# Patient Record
Sex: Male | Born: 1995 | Race: White | Hispanic: No | Marital: Single | Smoking: Current every day smoker
Health system: Southern US, Community
[De-identification: ages and names within clinical notes are randomized; demographics above are authoritative.]

## PROBLEM LIST (undated history)

## (undated) DIAGNOSIS — R569 Unspecified convulsions: Principal | ICD-10-CM

## (undated) HISTORY — DX: Unspecified convulsions: R56.9

---
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
## 2015-11-06 ENCOUNTER — Emergency Department (HOSPITAL_COMMUNITY): Payer: 59

## 2015-11-06 ENCOUNTER — Emergency Department (HOSPITAL_COMMUNITY)
Admission: EM | Admit: 2015-11-06 | Discharge: 2015-11-07 | Disposition: A | Discharge status: Home / Self Care | Payer: 59 | Attending: Emergency Medicine | Admitting: Emergency Medicine

## 2015-11-06 ENCOUNTER — Encounter (HOSPITAL_COMMUNITY): Payer: Self-pay | Admitting: Emergency Medicine

## 2015-11-06 DIAGNOSIS — R569 Unspecified convulsions: Secondary | ICD-10-CM | POA: Diagnosis not present

## 2015-11-06 LAB — BASIC METABOLIC PANEL
Anion gap: 12 (ref 5–15)
BUN: 13 mg/dL (ref 6–20)
CALCIUM: 9.5 mg/dL (ref 8.9–10.3)
CHLORIDE: 105 mmol/L (ref 101–111)
CO2: 23 mmol/L (ref 22–32)
CREATININE: 1.06 mg/dL (ref 0.61–1.24)
GFR calc Af Amer: >60 mL/min (ref >60)
GFR calc non Af Amer: >60 mL/min (ref >60)
GLUCOSE: 110 mg/dL — AB (ref 65–99)
Potassium: 3.9 mmol/L (ref 3.5–5.1)
Sodium: 140 mmol/L (ref 135–145)

## 2015-11-06 LAB — CBC
HCT: 45.2 % (ref 39.0–52.0)
Hemoglobin: 15.6 g/dL (ref 13.0–17.0)
MCH: 31.6 pg (ref 26.0–34.0)
MCHC: 34.5 g/dL (ref 30.0–36.0)
MCV: 91.5 fL (ref 78.0–100.0)
PLATELETS: 224 10*3/uL (ref 150–400)
RBC: 4.94 MIL/uL (ref 4.22–5.81)
RDW: 13.1 % (ref 11.5–15.5)
WBC: 15.2 10*3/uL — ABNORMAL HIGH (ref 4.0–10.5)

## 2015-11-06 NOTE — ED Notes (Signed)
Patient transported to CT 

## 2015-11-06 NOTE — ED Notes (Signed)
Patient presents with family for seizure like activity x2-3 minutes. Mother reports fists clinched, body tensed, denies urinary incontinence or oral trauma. Denies c/c.

## 2015-11-06 NOTE — ED Provider Notes (Signed)
CSN: [id number]     Arrival date & time 11/06/15  2201 History   First MD Initiated Contact with Patient 11/06/15 2259     Chief Complaint  Patient presents with  . Seizures     The history is provided by the patient. No language interpreter was used.   Joel Hicks is a 20 y.o. male who presents to the Emergency Department complaining of seizure. History is provided by the patient and his father. The patient was getting his car to get his girlfriend's house this afternoon. His mother heard a noise coming from the driveway and went to investigate. It appeared that he tried to cart started the car repeatedly and she found him having generalized seizure-like activity in the car. His father had to frequently due to the vehicle to get them out.  He was removed from the car he was stiffened up and breathing spontaneously. He has no other having the generalized shaking. He did have a postictal period when he was confused and had slurred speech. Currently the patient has no complaints. He does not recall the event. There was no urinary incontinence or tongue biting. His father has a history of AVM with one time a grand mal seizure. There is no other significant family history. The patient has occasional alcohol use, no drug use. 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  History reviewed. No pertinent past medical history. History reviewed. No pertinent past surgical history. No family history on file. Social History  Substance Use Topics  . Smoking status: Never Smoker   . Smokeless tobacco: None  . Alcohol Use: Yes    Review of Systems  All other systems reviewed and are negative.     Allergies  Review of patient's allergies indicates no known allergies.  Home Medications   Prior to Admission medications   Not on File   BP 112/53 mmHg  Pulse 68  Temp(Src) 97.8 F (36.6 C) (Oral)  Resp 14  Wt 145 lb (65.772 kg)  SpO2 98% Physical Exam  Constitutional: He is oriented to  person, place, and time. He appears well-developed and well-nourished.  HENT:  Head: Normocephalic and atraumatic.  No intra oral injury.  Eyes: EOM are normal. Pupils are equal, round, and reactive to light.  Cardiovascular: Normal rate and regular rhythm.   No murmur heard. Pulmonary/Chest: Effort normal and breath sounds normal. No respiratory distress.  Abdominal: Soft. There is no tenderness. There is no rebound and no guarding.  Musculoskeletal: He exhibits no edema or tenderness.  Neurological: He is alert and oriented to person, place, and time. No cranial nerve deficit.  Skin: Skin is warm and dry.  Psychiatric: He has a normal mood and affect. His behavior is normal.  Nursing note and vitals reviewed.   ED Course  Procedures (including critical care time) Labs Review Labs Reviewed  BASIC METABOLIC PANEL - Abnormal; Notable for the following:    Glucose, Bld 110 (*)    All other components within normal limits  CBC - Abnormal; Notable for the following:    WBC 15.2 (*)    All other components within normal limits  URINALYSIS, ROUTINE W REFLEX MICROSCOPIC (NOT AT [facility]) - Abnormal; Notable for the following:    Color, Urine AMBER (*)    APPearance CLOUDY (*)    Protein, ur 100 (*)    All other components within normal limits  URINE MICROSCOPIC-ADD ON - Abnormal; Notable for the following:    Squamous Epithelial /  LPF 0-5 (*)    Bacteria, UA RARE (*)    Casts GRANULAR CAST (*)    All other components within normal limits  URINE RAPID DRUG SCREEN, HOSP PERFORMED  CBG MONITORING, ED    Imaging Review Ct Head Wo Contrast  11/06/2015  CLINICAL DATA:  2-3 minutes episode of seizure like activity, clenched fist, no incontinence. Family history of AVM. EXAM: CT HEAD WITHOUT CONTRAST TECHNIQUE: Contiguous axial images were obtained from the base of the skull through the vertex without intravenous contrast. COMPARISON:  None. FINDINGS: The ventricles and sulci are normal. No  intraparenchymal hemorrhage, mass effect nor midline shift. No acute large vascular territory infarcts. No abnormal extra-axial fluid collections. Basal cisterns are patent. No skull fracture. The included ocular globes and orbital contents are non-suspicious. Imaged paranasal sinuses are well-aerated. Tiny RIGHT ethmoid osteoma. Small LEFT mastoid effusion. Fullness of the nasopharyngeal soft tissues compatible with patient's young age. IMPRESSION: Negative noncontrast CT head. Small LEFT mastoid effusion. Electronically Signed   By: Awilda Metro M.D.   On: 11/06/2015 23:58   I have personally reviewed and evaluated these images and lab results as part of my medical decision-making.   EKG Interpretation   Date/Time:  Tuesday November 06 2015 23:33:01 EST Ventricular Rate:  73 PR Interval:  150 QRS Duration: 81 QT Interval:  384 QTC Calculation: 423 R Axis:   86 Text Interpretation:  Sinus rhythm LVH by voltage ST elev, probable normal  early repol pattern Confirmed by Lincoln Brigham [phone]) on 11/06/2015 11:47:41  PM      MDM   Final diagnoses:  New onset seizure [facility])    Patient here for evaluation of new onset seizure-like activity. He is asymptomatic in the emergency department and amnestic to events. He has no focal neurologic deficits. No evidence of acute intracranial abnormality. Exam is not consistent with mastoiditis. Discussed with patient and parents new-onset seizure and seizure precautions. Discussed the importance of avoiding driving or any activities that would put him at risk if he were to have a recurrent seizure. Discussed the importance of neurology follow-up as well as ER return precautions.    Tilden Fossa, MD 11/07/15 [phone]

## 2015-11-07 ENCOUNTER — Ambulatory Visit (INDEPENDENT_AMBULATORY_CARE_PROVIDER_SITE_OTHER): Payer: 59 | Admitting: Neurology

## 2015-11-07 ENCOUNTER — Encounter: Payer: Self-pay | Admitting: Neurology

## 2015-11-07 VITALS — BP 96/59 | HR 63 | Ht 72.0 in | Wt 153.5 lb

## 2015-11-07 DIAGNOSIS — R569 Unspecified convulsions: Secondary | ICD-10-CM | POA: Insufficient documentation

## 2015-11-07 HISTORY — DX: Unspecified convulsions: R56.9

## 2015-11-07 LAB — URINE MICROSCOPIC-ADD ON

## 2015-11-07 LAB — URINALYSIS, ROUTINE W REFLEX MICROSCOPIC
BILIRUBIN URINE: NEGATIVE
Glucose, UA: NEGATIVE mg/dL
Hgb urine dipstick: NEGATIVE
Ketones, ur: NEGATIVE mg/dL
Leukocytes, UA: NEGATIVE
NITRITE: NEGATIVE
Protein, ur: 100 mg/dL — AB
SPECIFIC GRAVITY, URINE: 1.029 (ref 1.005–1.030)
pH: 5 (ref 5.0–8.0)

## 2015-11-07 LAB — RAPID URINE DRUG SCREEN, HOSP PERFORMED
Amphetamines: NOT DETECTED
BARBITURATES: NOT DETECTED
Benzodiazepines: NOT DETECTED
Cocaine: NOT DETECTED
Opiates: NOT DETECTED
Tetrahydrocannabinol: NOT DETECTED

## 2015-11-07 MED ORDER — LEVETIRACETAM 500 MG PO TABS: 500.0000 mg | ORAL_TABLET | Freq: Two times a day (BID) | ORAL | Status: DC

## 2015-11-07 NOTE — ED Notes (Signed)
Discharge instructions and follow up care reviewed with patient and family. Patient and family verbalized understanding. 

## 2015-11-07 NOTE — Patient Instructions (Signed)
We will check MRI of the brain and get EEG evaluation. We will start Keppra for the seizures.   Epilepsy Epilepsy is a disorder in which a person has repeated seizures over time. A seizure is a release of abnormal electrical activity in the brain. Seizures can cause a change in attention, behavior, or the ability to remain awake and alert (altered mental status). Seizures often involve uncontrollable shaking (convulsions).  Most people with epilepsy lead normal lives. However, people with epilepsy are at an increased risk of falls, accidents, and injuries. Therefore, it is important to begin treatment right away. CAUSES  Epilepsy has many possible causes. Anything that disturbs the normal pattern of brain cell activity can lead to seizures. This may include:   Head injury.  Birth trauma.  High fever as a child.  Stroke.  Bleeding into or around the brain.  Certain drugs.  Prolonged low oxygen, such as what occurs after CPR efforts.  Abnormal brain development.  Certain illnesses, such as meningitis, encephalitis (brain infection), malaria, and other infections.  An imbalance of nerve signaling chemicals (neurotransmitters).  SIGNS AND SYMPTOMS  The symptoms of a seizure can vary greatly from one person to another. Right before a seizure, you may have a warning (aura) that a seizure is about to occur. An aura may include the following symptoms:  Fear or anxiety.  Nausea.  Feeling like the room is spinning (vertigo).  Vision changes, such as seeing flashing lights or spots. Common symptoms during a seizure include:  Abnormal sensations, such as an abnormal smell or a bitter taste in the mouth.   Sudden, general body stiffness.   Convulsions that involve rhythmic jerking of the face, arm, or leg on one or both sides.   Sudden change in consciousness.   Appearing to be awake but not responding.   Appearing to be asleep but cannot be awakened.   Grimacing,  chewing, lip smacking, drooling, tongue biting, or loss of bowel or bladder control. After a seizure, you may feel sleepy for a while. DIAGNOSIS  Your health care provider will ask about your symptoms and take a medical history. Descriptions from any witnesses to your seizures will be very helpful in the diagnosis. A physical exam, including a detailed neurological exam, is necessary. Various tests may be done, such as:   An electroencephalogram (EEG). This is a painless test of your brain waves. In this test, a diagram is created of your brain waves. These diagrams can be interpreted by a specialist.  An MRI of the brain.   A CT scan of the brain.   A spinal tap (lumbar puncture, LP).  Blood tests to check for signs of infection or abnormal blood chemistry. TREATMENT  There is no cure for epilepsy, but it is generally treatable. Once epilepsy is diagnosed, it is important to begin treatment as soon as possible. For most people with epilepsy, seizures can be controlled with medicines. The following may also be used:  A pacemaker for the brain (vagus nerve stimulator) can be used for people with seizures that are not well controlled by medicine.  Surgery on the brain. For some people, epilepsy eventually goes away. HOME CARE INSTRUCTIONS   Follow your health care provider's recommendations on driving and safety in normal activities.  Get enough rest. Lack of sleep can cause seizures.  Only take over-the-counter or prescription medicines as directed by your health care provider. Take any prescribed medicine exactly as directed.  Avoid any known triggers of  your seizures.  Keep a seizure diary. Record what you recall about any seizure, especially any possible trigger.   Make sure the people you live and work with know that you are prone to seizures. They should receive instructions on how to help you. In general, a witness to a seizure should:   Cushion your head and body.    Turn you on your side.   Avoid unnecessarily restraining you.   Not place anything inside your mouth.   Call for emergency medical help if there is any question about what has occurred.   Follow up with your health care provider as directed. You may need regular blood tests to monitor the levels of your medicine.  SEEK MEDICAL CARE IF:   You develop signs of infection or other illness. This might increase the risk of a seizure.   You seem to be having more frequent seizures.   Your seizure pattern is changing.  SEEK IMMEDIATE MEDICAL CARE IF:   You have a seizure that does not stop after a few moments.   You have a seizure that causes any difficulty in breathing.   You have a seizure that results in a very severe headache.   You have a seizure that leaves you with the inability to speak or use a part of your body.    This information is not intended to replace advice given to you by your health care provider. Make sure you discuss any questions you have with your health care provider.   Document Released: 10/06/2005 Document Revised: 07/27/2013 Document Reviewed: 05/18/2013 Elsevier Interactive Patient Education [employer]

## 2015-11-07 NOTE — Progress Notes (Signed)
Reason for visit: Seizures  Referring physician: [place]  Joel Hicks is a 20 y.o. male  History of present illness:  Joel Hicks is a 20 year old right-handed white male without significant past medical history. The patient had a witnessed generalized seizure event on the day prior to this evaluation, he went to the emergency room. The patient was getting in the car to drive, he had onset of the seizure at that time. The patient did not bite his tongue or lose control of the bowels or the bladder. The patient had no warning with the seizure event. He has no prior history of head trauma, no history of febrile seizures. The father has a history of an AVM, he has had one seizure previously. The patient underwent a CT scan of brain that was unremarkable, blood work showed elevation in the white blood count consistent with a prior seizure. Urine drug screen was negative. The patient had a second seizure today around 12 noon, again without warning. The patient did not bite his tongue or lose control of the bowels or the bladder. He comes into the office today for an evaluation.  Past Medical History  Diagnosis Date  . Seizures (HCC) 11/07/2015    History reviewed. No pertinent past surgical history.  Family History  Problem Relation Age of Onset  . AVM Father   . Seizures Father     Social history:  reports that he has never smoked. He has never used smokeless tobacco. He reports that he drinks alcohol. He reports that he does not use illicit drugs.  Medications:  Prior to Admission medications   Not on File     No Known Allergies  ROS:  Out of a complete 14 system review of symptoms, the patient complains only of the following symptoms, and all other reviewed systems are negative.  Seizures  Blood pressure 96/59, pulse 63, height 6' (1.829 m), weight 153 lb 8 oz (69.627 kg).  Physical Exam  General: The patient is alert and cooperative at the time of the  examination.  Eyes: Pupils are equal, round, and reactive to light. Discs are flat bilaterally.  Neck: The neck is supple, no carotid bruits are noted.  Respiratory: The respiratory examination is clear.  Cardiovascular: The cardiovascular examination reveals a regular rate and rhythm, no obvious murmurs or rubs are noted.  Skin: Extremities are without significant edema.  Neurologic Exam  Mental status: The patient is alert and oriented x 3 at the time of the examination. The patient has apparent normal recent and remote memory, with an apparently normal attention span and concentration ability.  Cranial nerves: Facial symmetry is present. There is good sensation of the face to pinprick and soft touch bilaterally. The strength of the facial muscles and the muscles to head turning and shoulder shrug are normal bilaterally. Speech is well enunciated, no aphasia or dysarthria is noted. Extraocular movements are full. Visual fields are full. The tongue is midline, and the patient has symmetric elevation of the soft palate. No obvious hearing deficits are noted.  Motor: The motor testing reveals 5 over 5 strength of all 4 extremities. Good symmetric motor tone is noted throughout.  Sensory: Sensory testing is intact to pinprick, soft touch, vibration sensation, and position sense on all 4 extremities. No evidence of extinction is noted.  Coordination: Cerebellar testing reveals good finger-nose-finger and heel-to-shin bilaterally.  Gait and station: Gait is normal. Tandem gait is normal. Romberg is negative. No drift is  seen.  Reflexes: Deep tendon reflexes are symmetric and normal bilaterally. Toes are downgoing bilaterally.   CT head 11/06/15:  IMPRESSION: Negative noncontrast CT head.  Small LEFT mastoid effusion.  * CT scan images were reviewed online. I agree with the written report.    Assessment/Plan:  1. Generalized seizure events  The patient has had 2 seizures within  several hours of each other. He will be placed on Keppra taking 500 mg twice daily. He will be set up for EEG evaluation, and MRI of the brain with and without gadolinium enhancement. He will receive a 1 g Depacon load IV before he leaves the office to give him seizure coverage while he gets on the Keppra dosing. He is not operating motor vehicle. The patient may return to work as long as he does not climb to heights or operate a car or heavy equipment. He will follow-up in 3 months. No driving for 6 months following the last seizure.  Marlan Palau MD 11/07/2015 7:34 PM  [facility] [address] [place], [place] [address]  Phone [phone] Fax [phone]

## 2015-11-07 NOTE — Discharge Instructions (Signed)

## 2015-11-07 NOTE — ED Notes (Signed)
MD at bedside. 

## 2015-11-08 ENCOUNTER — Ambulatory Visit: Payer: Self-pay | Admitting: Neurology

## 2015-11-14 ENCOUNTER — Ambulatory Visit (INDEPENDENT_AMBULATORY_CARE_PROVIDER_SITE_OTHER): Payer: 59

## 2015-11-14 DIAGNOSIS — R569 Unspecified convulsions: Secondary | ICD-10-CM | POA: Diagnosis not present

## 2015-11-14 MED ORDER — GADOPENTETATE DIMEGLUMINE 469.01 MG/ML IV SOLN: 15.0000 mL | Freq: Once | INTRAVENOUS | Status: AC | PRN

## 2015-11-15 ENCOUNTER — Telehealth: Payer: Self-pay | Admitting: Neurology

## 2015-11-15 NOTE — Telephone Encounter (Signed)
  I called the patient, talked with the mother. The MRI of the brain was normal. She indicates that the maternal uncle had seizures beginning at age 22, and that narcolepsy is seen in 3 or 4 family members on the mother's side.  MRI brain 11/15/15:  IMPRESSION:  Unremarkable MRI brain (with and without). Mild non-specific fluid/inflammation within the left mastoid air cells. No acute findings.

## 2015-12-05 ENCOUNTER — Ambulatory Visit (INDEPENDENT_AMBULATORY_CARE_PROVIDER_SITE_OTHER): Payer: BLUE CROSS/BLUE SHIELD | Admitting: Neurology

## 2015-12-05 ENCOUNTER — Telehealth: Payer: Self-pay | Admitting: Neurology

## 2015-12-05 DIAGNOSIS — R569 Unspecified convulsions: Secondary | ICD-10-CM

## 2015-12-05 NOTE — Telephone Encounter (Signed)
I called the patient. The EEG study was OK. He is to stay on Keppra.

## 2015-12-05 NOTE — Procedures (Signed)
    History:  Joel Hicks is a 20 year old patient with a history of 2 last seizures around 11/06/2015. The episodes were generalized in nature, without warning. The patient is being evaluated for these events.  This is a routine EEG. No skull defects are noted. Medications include Keppra.   EEG classification: Normal awake and drowsy  Description of the recording: The background rhythms of this recording consists of a fairly well modulated medium amplitude alpha rhythm of 10 Hz that is reactive to eye opening and closure. As the record progresses, the patient appears to remain in the waking state throughout the recording. Photic stimulation was performed, resulting in a bilateral and symmetric photic driving response. Hyperventilation was also performed, resulting in a minimal buildup of the background rhythm activities without significant slowing seen. Toward the end of the recording, the patient enters the drowsy state with slight symmetric slowing seen. The patient never enters stage II sleep. At no time during the recording does there appear to be evidence of spike or spike wave discharges or evidence of focal slowing. EKG monitor shows no evidence of cardiac rhythm abnormalities with a heart rate of 60.  Impression: This is a normal EEG recording in the waking and drowsy state. No evidence of ictal or interictal discharges are seen.

## 2016-01-07 ENCOUNTER — Telehealth: Payer: Self-pay | Admitting: Neurology

## 2016-01-07 MED ORDER — LEVETIRACETAM 500 MG PO TABS: 500.0000 mg | ORAL_TABLET | Freq: Two times a day (BID) | ORAL | Status: DC

## 2016-01-07 NOTE — Telephone Encounter (Signed)
Done.  Spoke to mother.  Appt 02-19-16.

## 2016-01-07 NOTE — Telephone Encounter (Signed)
Mother called to advise Rx for levETIRAcetam (KEPPRA) 500 MG tablet will expire just before next appointment 02/19/16 with Dr. Anne HahnWillis.

## 2016-01-07 NOTE — Telephone Encounter (Signed)
May reach Mother at work# [phone] or cell# [phone]

## 2016-01-21 ENCOUNTER — Emergency Department (HOSPITAL_COMMUNITY)
Admission: EM | Admit: 2016-01-21 | Discharge: 2016-01-21 | Disposition: A | Discharge status: Home / Self Care | Payer: BLUE CROSS/BLUE SHIELD | Attending: Emergency Medicine | Admitting: Emergency Medicine

## 2016-01-21 ENCOUNTER — Emergency Department (HOSPITAL_COMMUNITY): Payer: BLUE CROSS/BLUE SHIELD

## 2016-01-21 ENCOUNTER — Encounter (HOSPITAL_COMMUNITY): Payer: Self-pay | Admitting: *Deleted

## 2016-01-21 DIAGNOSIS — R569 Unspecified convulsions: Secondary | ICD-10-CM | POA: Insufficient documentation

## 2016-01-21 DIAGNOSIS — Y9389 Activity, other specified: Secondary | ICD-10-CM | POA: Insufficient documentation

## 2016-01-21 DIAGNOSIS — W01198A Fall on same level from slipping, tripping and stumbling with subsequent striking against other object, initial encounter: Secondary | ICD-10-CM | POA: Insufficient documentation

## 2016-01-21 DIAGNOSIS — Z23 Encounter for immunization: Secondary | ICD-10-CM | POA: Insufficient documentation

## 2016-01-21 DIAGNOSIS — S0990XA Unspecified injury of head, initial encounter: Secondary | ICD-10-CM | POA: Diagnosis present

## 2016-01-21 DIAGNOSIS — Y990 Civilian activity done for income or pay: Secondary | ICD-10-CM | POA: Diagnosis not present

## 2016-01-21 DIAGNOSIS — Y9289 Other specified places as the place of occurrence of the external cause: Secondary | ICD-10-CM | POA: Insufficient documentation

## 2016-01-21 DIAGNOSIS — Z79899 Other long term (current) drug therapy: Secondary | ICD-10-CM | POA: Insufficient documentation

## 2016-01-21 DIAGNOSIS — S0101XA Laceration without foreign body of scalp, initial encounter: Secondary | ICD-10-CM | POA: Insufficient documentation

## 2016-01-21 DIAGNOSIS — S0181XA Laceration without foreign body of other part of head, initial encounter: Secondary | ICD-10-CM

## 2016-01-21 DIAGNOSIS — F17200 Nicotine dependence, unspecified, uncomplicated: Secondary | ICD-10-CM | POA: Insufficient documentation

## 2016-01-21 LAB — BASIC METABOLIC PANEL
Anion gap: 12 (ref 5–15)
BUN: 17 mg/dL (ref 6–20)
CHLORIDE: 103 mmol/L (ref 101–111)
CO2: 25 mmol/L (ref 22–32)
CREATININE: 1.08 mg/dL (ref 0.61–1.24)
Calcium: 9.6 mg/dL (ref 8.9–10.3)
GFR calc Af Amer: >60 mL/min (ref >60)
GFR calc non Af Amer: >60 mL/min (ref >60)
GLUCOSE: 117 mg/dL — AB (ref 65–99)
Potassium: 3.2 mmol/L — ABNORMAL LOW (ref 3.5–5.1)
Sodium: 140 mmol/L (ref 135–145)

## 2016-01-21 LAB — CBC
HCT: 41.4 % (ref 39.0–52.0)
Hemoglobin: 14.3 g/dL (ref 13.0–17.0)
MCH: 30.6 pg (ref 26.0–34.0)
MCHC: 34.5 g/dL (ref 30.0–36.0)
MCV: 88.5 fL (ref 78.0–100.0)
PLATELETS: 188 10*3/uL (ref 150–400)
RBC: 4.68 MIL/uL (ref 4.22–5.81)
RDW: 12.8 % (ref 11.5–15.5)
WBC: 8.5 10*3/uL (ref 4.0–10.5)

## 2016-01-21 MED ORDER — SODIUM CHLORIDE 0.9 % IV SOLN
500.0000 mg | Freq: Once | INTRAVENOUS | Status: AC
  Administered 2016-01-21: 500 mg via INTRAVENOUS
  Filled 2016-01-21: qty 5

## 2016-01-21 MED ORDER — LIDOCAINE-EPINEPHRINE (PF) 2 %-1:200000 IJ SOLN
20.0000 mL | Freq: Once | INTRAMUSCULAR | Status: AC
  Administered 2016-01-21: 20 mL via INTRADERMAL
  Filled 2016-01-21: qty 20

## 2016-01-21 MED ORDER — TETANUS-DIPHTH-ACELL PERTUSSIS 5-2.5-18.5 LF-MCG/0.5 IM SUSP
0.5000 mL | Freq: Once | INTRAMUSCULAR | Status: AC
  Administered 2016-01-21: 0.5 mL via INTRAMUSCULAR
  Filled 2016-01-21: qty 0.5

## 2016-01-21 MED ORDER — BACITRACIN ZINC 500 UNIT/GM EX OINT
1.0000 "application " | TOPICAL_OINTMENT | Freq: Two times a day (BID) | CUTANEOUS | Status: DC
  Administered 2016-01-21: 1 via TOPICAL
  Filled 2016-01-21: qty 0.9

## 2016-01-21 NOTE — ED Notes (Signed)
Tech removed IV from pt's left AC. IV was clean, dry and intact.

## 2016-01-21 NOTE — ED Notes (Signed)
Pt was working at a [employer] where he was working and lifting a large pole (200-300lbs)  Pt fell back and struck his head, pole rolled on him.  30sec seizure activity with this.  Bystander was unsure if the seizure or the fall occurred first.  Pt does not recall this event and denies any seizure activity or heavy drinking or drugs.  Pt is alert and oriented now and has abrasion to right cheek (from pole) and laceration to left side of the head.  Pt has HA, no neuro deficit.  [facility]

## 2016-01-21 NOTE — ED Notes (Signed)
Keppra shows up on his home meds, pt denies that he is taking this

## 2016-01-21 NOTE — ED Provider Notes (Signed)
CSN: [id number]     Arrival date & time 01/21/16  1625 History   First MD Initiated Contact with Patient 01/21/16 1625     Chief Complaint  Patient presents with  . Seizures     (Consider location/radiation/quality/duration/timing/severity/associated sxs/prior Treatment) HPI Comments: The patient is a 20 year old male, he has a history of a seizure which occurred approximately 3 months ago, he was seen in the emergency department at that time. He comes to the hospital today, it was reported that he had a injury to his head at work when he fell carrying a heavy pole. There was seizure activity that was witnessed but it is unsure whether this occurred before or after the fall. The patient does not recall a history of seizures, he does appear to be confused after his fall and head injury, paramedics report that he is asking the same questions over and over. Reportedly the patient has been driving his car, there are notes regarding prescription Keppra which has been prescribed to the patient from the neurologist in the past.  Review of the medical record shows multiple notes from the neurology office including a normal EEG, he was to be taking Keppra twice daily. 500 mg each dose  Patient is a 20 y.o. male presenting with seizures. The history is provided by the patient.  Seizures   Past Medical History  Diagnosis Date  . Seizures (HCC) 11/07/2015    another seizure 01/21/16   History reviewed. No pertinent past surgical history. Family History  Problem Relation Age of Onset  . AVM Father   . Seizures Father    Social History  Substance Use Topics  . Smoking status: Current Every Day Smoker -- 0.50 packs/day  . Smokeless tobacco: Never Used  . Alcohol Use: 0.0 oz/week    0 Standard drinks or equivalent per week     Comment: occasionally    Review of Systems  Unable to perform ROS: Mental status change  Neurological: Positive for seizures.      Allergies  Review of patient's  allergies indicates no known allergies.  Home Medications   Prior to Admission medications   Medication Sig Start Date End Date Taking? Authorizing Provider  levETIRAcetam (KEPPRA) 500 MG tablet Take 1 tablet (500 mg total) by mouth 2 (two) times daily. 01/07/16  Yes York Spaniel, MD   BP 109/71 mmHg  Pulse 77  Temp(Src) 98 F (36.7 C) (Oral)  Resp 20  Ht 6' (1.829 m)  Wt 145 lb (65.772 kg)  BMI 19.66 kg/m2  SpO2 100% Physical Exam  Constitutional: He appears well-developed and well-nourished. No distress.  HENT:  Head: Normocephalic and atraumatic.  Mouth/Throat: Oropharynx is clear and moist. No oropharyngeal exudate.  2.5 cm laceration to the left temporal scalp, linear, superficial, no foreign bodies. No malocclusion, no hemotympanum, no raccoon eyes, no battle sign, abrasion present to the right maxillary cheek, laceration present underneath the chin, V-shaped, approximately 1 cm  Eyes: Conjunctivae and EOM are normal. Pupils are equal, round, and reactive to light. Right eye exhibits no discharge. Left eye exhibits no discharge. No scleral icterus.  Neck: Normal range of motion. Neck supple. No JVD present. No thyromegaly present.  Cardiovascular: Normal rate, regular rhythm, normal heart sounds and intact distal pulses.  Exam reveals no gallop and no friction rub.   No murmur heard. Pulmonary/Chest: Effort normal and breath sounds normal. No respiratory distress. He has no wheezes. He has no rales.  Abdominal: Soft. Bowel sounds are normal.  He exhibits no distension and no mass. There is no tenderness.  Musculoskeletal: Normal range of motion. He exhibits no edema or tenderness.  Soft compartments, supple joints diffusely, no spinal tenderness  Lymphadenopathy:    He has no cervical adenopathy.  Neurological: He is alert. Coordination normal.  The patient is alert and oriented to his name and birth date, the city that he lives in but is unable to tell us the date, the  month and has no memory of the events of the day. Finger-nose-finger is normal, strength in all 4 extremities normal, speech is normal  Skin: Skin is warm and dry. No rash noted. No erythema.  Psychiatric: He has a normal mood and affect. His behavior is normal.  Nursing note and vitals reviewed.   ED Course  .Marland Kitchen.Laceration Repair Performed by: Eber HongMILLER, Frankee Gritz Authorized by: Eber HongMILLER, Vonita Calloway Consent: Verbal consent obtained. Risks and benefits: risks, benefits and alternatives were discussed Consent given by: patient Patient understanding: patient states understanding of the procedure being performed Required items: required blood products, implants, devices, and special equipment available Patient identity confirmed: verbally with patient Time out: Immediately prior to procedure a "time out" was called to verify the correct patient, procedure, equipment, support staff and site/side marked as required. Body area: head/neck Location details: chin Laceration length: 1.5 cm Foreign bodies: no foreign bodies Tendon involvement: none Nerve involvement: none Vascular damage: no Anesthesia: local infiltration Local anesthetic: lidocaine 1% with epinephrine Anesthetic total: 1 ml Patient sedated: no Preparation: Patient was prepped and draped in the usual sterile fashion. Irrigation solution: saline Irrigation method: syringe Amount of cleaning: standard Debridement: none Degree of undermining: none Skin closure: 5-0 Prolene Number of sutures: 3 Technique: simple Approximation: close Approximation difficulty: simple Dressing: antibiotic ointment and pressure dressing Patient tolerance: Patient tolerated the procedure well with no immediate complications  .Marland Kitchen.Laceration Repair Date/Time: 01/21/2016 6:59 PM Performed by: Eber HongMILLER, Ayelen Sciortino Authorized by: Eber HongMILLER, Svea Pusch Consent: Verbal consent obtained. Risks and benefits: risks, benefits and alternatives were discussed Consent given by:  patient Patient understanding: patient states understanding of the procedure being performed Imaging studies: imaging studies available Required items: required blood products, implants, devices, and special equipment available Patient identity confirmed: verbally with patient Time out: Immediately prior to procedure a "time out" was called to verify the correct patient, procedure, equipment, support staff and site/side marked as required. Body area: head/neck Location details: scalp Laceration length: 2.5 cm Foreign bodies: no foreign bodies Tendon involvement: none Nerve involvement: none Vascular damage: no Patient sedated: no Preparation: Patient was prepped and draped in the usual sterile fashion. Irrigation solution: saline Irrigation method: syringe Amount of cleaning: standard Debridement: none Degree of undermining: none Skin closure: staples Number of sutures: 2 Technique: simple Approximation: close Approximation difficulty: simple Dressing: antibiotic ointment Patient tolerance: Patient tolerated the procedure well with no immediate complications   (including critical care time) Labs Review Labs Reviewed  BASIC METABOLIC PANEL - Abnormal; Notable for the following:    Potassium 3.2 (*)    Glucose, Bld 117 (*)    All other components within normal limits  CBC    Imaging Review Ct Head Wo Contrast  01/21/2016  CLINICAL DATA:  Patient fell after lifting a heavy object. Witnessed seizure activity. Headache. Previous seizure in January. EXAM: CT HEAD WITHOUT CONTRAST TECHNIQUE: Contiguous axial images were obtained from the base of the skull through the vertex without intravenous contrast. COMPARISON:  11/06/2015 CT head.  11/14/2015 MR. FINDINGS: No evidence for acute infarction, hemorrhage, mass  lesion, hydrocephalus, or extra-axial fluid. No atrophy or white matter disease. Intact calvarium. No acute sinus disease. Over the LEFT posterior frontoparietal region, there  is a scalp hematoma and laceration. See image 46 series 3. No foreign body. Redemonstrated is LEFT mastoid fluid. No visible subtemporal inflammatory process. Based on review of prior MR, no LEFT transverse/sigmoid venous sinus thrombosis or abnormal enhancement in the LEFT temporal lobe. Compared with priors, no change except for scalp hematoma. IMPRESSION: No skull fracture or intracranial hemorrhage. Normal intracranial anatomy.  No seizure focus is evident. Stable LEFT mastoid effusion. LEFT posterior frontoparietal scalp hematoma and laceration. Electronically Signed   By: Elsie Stain M.D.   On: 01/21/2016 17:20   I have personally reviewed and evaluated these images and lab results as part of my medical decision-making.    MDM   Final diagnoses:  Seizure (HCC)  Laceration of chin without complication, initial encounter  Laceration of scalp without complication, initial encounter    Recurrent seizures, the patient does not recall whether he has been taking his medications, we'll give loading dose of Keppra, CT scan of the brain, repair of lacerations, vital signs reassuring, the patient does not appear clinically toxic at this time though will need some watching of his clinical status in this post seizure period.  Meds given in ED:  Medications  bacitracin ointment 1 application (1 application Topical Given 01/21/16 1653)  lidocaine-EPINEPHrine (XYLOCAINE W/EPI) 2 %-1:200000 (PF) injection 20 mL (20 mLs Intradermal Given 01/21/16 1753)  Tdap (BOOSTRIX) injection 0.5 mL (0.5 mLs Intramuscular Given 01/21/16 1649)  levETIRAcetam (KEPPRA) 500 mg in sodium chloride 0.9 % 100 mL IVPB (0 mg Intravenous Stopped 01/21/16 1849)       Eber Hong, MD 01/21/16 1900

## 2016-01-21 NOTE — Discharge Instructions (Signed)
You MUST take your medicine twice a day - see the Neurologist this week for a recheck  Stitches need to come out in 7 days as well as the 2 staples in your scalp - take a shower immediately when you get home to wash the blood out of your hair, then keep clean and dry for 3 days.  Topical antibiotic cream only.

## 2016-01-22 ENCOUNTER — Telehealth: Payer: Self-pay | Admitting: Neurology

## 2016-01-22 MED ORDER — LEVETIRACETAM 750 MG PO TABS: 750.0000 mg | ORAL_TABLET | Freq: Two times a day (BID) | ORAL | Status: DC

## 2016-01-22 NOTE — Telephone Encounter (Signed)
I called the patient. The patient has seizure yesterday, he did not miss a dose of the Keppra. We will go up on the dosing, he will start taking 750 mg twice daily. He will need clearance for return to work, I'll try to get him worked in this week.

## 2016-01-22 NOTE — Telephone Encounter (Signed)
Pt's mother called sts he had a seizure yesterday 01/21/16 and was taken to ED. Pt was at work pulling a pole off of a trailer when had a seizure. He fell hitting his on the ground. He has a concussion and received staples in his head for an open wound. Pt's mother is inquiring if he should be seen by Dr Anne HahnWillis today.

## 2016-01-23 NOTE — Telephone Encounter (Signed)
Appt made 0815 01-24-16 for RV per Dr. Anne HahnWillis reuqest.

## 2016-01-24 ENCOUNTER — Encounter: Payer: Self-pay | Admitting: Neurology

## 2016-01-24 ENCOUNTER — Ambulatory Visit (INDEPENDENT_AMBULATORY_CARE_PROVIDER_SITE_OTHER): Payer: BLUE CROSS/BLUE SHIELD | Admitting: Neurology

## 2016-01-24 ENCOUNTER — Telehealth: Payer: Self-pay | Admitting: *Deleted

## 2016-01-24 VITALS — BP 113/72 | HR 67 | Ht 72.0 in | Wt 143.5 lb

## 2016-01-24 DIAGNOSIS — R569 Unspecified convulsions: Secondary | ICD-10-CM | POA: Diagnosis not present

## 2016-01-24 NOTE — Progress Notes (Addendum)
Reason for visit: Seizures  Joel Hicks is an 20 y.o. male  History of present illness:  Joel Hicks is a 20 year old right-handed white male with a history of seizures that began in January of 2017. EEG evaluation was normal, but the patient has continued to have breakthrough seizures. The patient was placed on Keppra taking 500 mg twice daily, but he sustained a breakthrough seizure on 01/21/2016. The patient was at work at the time, he was lifting a light pole, and he had the seizure falling backwards, striking the back of his head. The patient sustained some lacerations to the right face. He did not bite his tongue. The patient had no warning of the seizure. The patient is out of work at this time, he is working as a Armed forces logistics/support/administrative officer. His employer does not wish him to come back to work until he has no restrictions. He returns to this office for an evaluation.  Past Medical History  Diagnosis Date  . Seizures (HCC) 11/07/2015    another seizure 01/21/16    History reviewed. No pertinent past surgical history.  Family History  Problem Relation Age of Onset  . AVM Father   . Seizures Father     Social history:  reports that he has been smoking.  He has never used smokeless tobacco. He reports that he drinks alcohol. He reports that he does not use illicit drugs.   No Known Allergies  Medications:  Prior to Admission medications   Medication Sig Start Date End Date Taking? Authorizing Provider  levETIRAcetam (KEPPRA) 750 MG tablet Take 1 tablet (750 mg total) by mouth 2 (two) times daily. 01/22/16  Yes Joel Spaniel, MD    ROS:  Out of a complete 14 system review of symptoms, the patient complains only of the following symptoms, and all other reviewed systems are negative.  Insomnia, frequent waking Neck pain, neck stiffness Skin wounds Headache, seizure Confusion with seizure  Blood pressure 113/72, pulse 67, height 6' (1.829 m), weight 143 lb 8 oz (65.091  kg).  Physical Exam  General: The patient is alert and cooperative at the time of the examination.  Skin: No significant peripheral edema is noted.   Neurologic Exam  Mental status: The patient is alert and oriented x 3 at the time of the examination. The patient has apparent normal recent and remote memory, with an apparently normal attention span and concentration ability.   Cranial nerves: Facial symmetry is present. Speech is normal, no aphasia or dysarthria is noted. Extraocular movements are full. Visual fields are full. An abrasion is noted on the right cheek.  Motor: The patient has good strength in all 4 extremities.  Sensory examination: Soft touch sensation is symmetric on the face, arms, and legs.  Coordination: The patient has good finger-nose-finger and heel-to-shin bilaterally.  Gait and station: The patient has a normal gait. Tandem gait is normal. Romberg is negative. No drift is seen.  Reflexes: Deep tendon reflexes are symmetric.   MRI brain 11/15/15:  IMPRESSION:  Unremarkable MRI brain (with and without). Mild non-specific fluid/inflammation within the left mastoid air cells. No acute findings.          Assessment/Plan:  1. Seizure disorder, recent recurrence  The patient will be increased on the Keppra taking 750 mg twice daily for 3-4 weeks, then we will try to convert him to 1000 milligrams twice daily. The patient will be going on short-term disability. He may wish to reconsider his choice of  careers. The patient is not to operate a motor vehicle, climb to heights, or work around Public affairs consultantdangerous machinery. He will follow-up in 4 months, sooner if needed.  Joel Palau. Keith Cloyce Blankenhorn MD 01/24/2016 7:56 PM  [facility] [address] [place], [place] [id number]  Phone [phone] Fax [phone]

## 2016-01-24 NOTE — Telephone Encounter (Signed)
Patient [employer] form on Sandy Y desk.

## 2016-01-25 DIAGNOSIS — Z0289 Encounter for other administrative examinations: Secondary | ICD-10-CM

## 2016-01-29 ENCOUNTER — Other Ambulatory Visit: Payer: Self-pay

## 2016-01-29 MED ORDER — LEVETIRACETAM 750 MG PO TABS: 750.0000 mg | ORAL_TABLET | Freq: Two times a day (BID) | ORAL | Status: AC

## 2016-01-29 NOTE — Telephone Encounter (Signed)
Received faxed request from pharmacy for 90 day refill. Retailed as requested.

## 2016-01-30 ENCOUNTER — Telehealth: Payer: Self-pay | Admitting: *Deleted

## 2016-01-30 NOTE — Telephone Encounter (Signed)
LMVM for pt to return call about when started STD (date).

## 2016-01-30 NOTE — Telephone Encounter (Signed)
Spoke to mother.  Pt stopped working on 01-21-16.  She had questions about last visit.  (mainly surgery mentioned).  If there is surgery to stop sz, then why not do it.   Please call.  Arlice [employer] work # listed or home if later.

## 2016-01-30 NOTE — Telephone Encounter (Signed)
I called the mother. Surgery is not yet indicated for the patient. We need to try several medications, if he does not respond to at least 2 or 3 different medications, surgical considerations may be undertaken. We are not there yet.

## 2016-01-30 NOTE — Telephone Encounter (Signed)
Patient's mother is returning your call.

## 2016-02-18 ENCOUNTER — Telehealth: Payer: Self-pay | Admitting: Neurology

## 2016-02-18 NOTE — Telephone Encounter (Addendum)
Pt's mother called said she been researching information on Visual ASE surgery and has [facility]. She was told there are several renowned surgions there that will look at pt's records and determine if he will be a candidate for the surgery. She has been told by [facility] if the records were sent they could be looked at free of charge. Records need to include OV, all testing including imaging and EEG sent to [phone] attn: Dara (p) [phone]. Pt's mother will send email thru mychart.

## 2016-02-19 ENCOUNTER — Ambulatory Visit: Payer: 59 | Admitting: Neurology

## 2016-02-19 NOTE — Telephone Encounter (Signed)
[facility] 02/19/16.

## 2016-03-05 NOTE — Telephone Encounter (Signed)
Additional STD paperwork completed, signed by Dr. Anne HahnWillis and [facility]ed back to [facility] @ [phone].

## 2016-03-07 DIAGNOSIS — Z0289 Encounter for other administrative examinations: Secondary | ICD-10-CM

## 2016-04-09 ENCOUNTER — Telehealth: Payer: Self-pay | Admitting: *Deleted

## 2016-04-09 NOTE — Telephone Encounter (Signed)
Pt medical records faxed to [facility] on 04/09/2016.

## 2016-04-10 ENCOUNTER — Telehealth: Payer: Self-pay | Admitting: Neurology

## 2016-04-10 NOTE — Telephone Encounter (Signed)
Joel Hicks's mother states that the first time Dr. Anne HahnWillis looked at the MRI that he saw no abnormalities.  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 The mother states she was so shocked she [facility] andl the doctors could find no scarring. She is wondering if Dr. Anne HahnWillis may have been looking at someone else's MRI results. Please pull and look at MRI again and give her a call tomorrow at work please.

## 2016-04-10 NOTE — Telephone Encounter (Signed)
I called. MRI the brain done previously is unremarkable. This was done with and without contrast. It is possible to have a scar on the brain the cannot be seen by MRI however, but the actual MRI of the brain appear to be normal. This is not inconsistent with a history of seizures.

## 2016-04-10 NOTE — Telephone Encounter (Signed)
Below is Dr. Anne HahnWillis' telephone note from MRI in January w/ no mention of scarring. This was not stated in OV notes from April appt either.   Expand All Collapse All    I called the patient, talked with the mother. The MRI of the brain was normal. She indicates that the maternal uncle had seizures beginning at age 312, and that narcolepsy is seen in 3 or 4 family members on the mother's side.  MRI brain 11/15/15:  IMPRESSION:  Unremarkable MRI brain (with and without). Mild non-specific fluid/inflammation within the left mastoid air cells. No acute findings.

## 2016-05-30 ENCOUNTER — Ambulatory Visit: Payer: 59 | Admitting: Neurology

## 2017-08-16 IMAGING — CT CT HEAD W/O CM
2 series · 15 of 30 positions shown, 17 images · non-contrast
Comparison: 11/06/2015 CT head.  11/14/2015 MR.

CLINICAL DATA: Patient fell after lifting a heavy object. Witnessed
seizure activity. Headache. Previous seizure in [REDACTED].

EXAM:
CT HEAD WITHOUT CONTRAST
TECHNIQUE: Contiguous axial images were obtained from the base of the skull
through the vertex without intravenous contrast.

[Series 2: head without · axial · non-contrast · 0.44mm/px · z∈[+1411,+1521]mm · 7 of 30 slices shown, 9 images]
[im 4/30  brain]
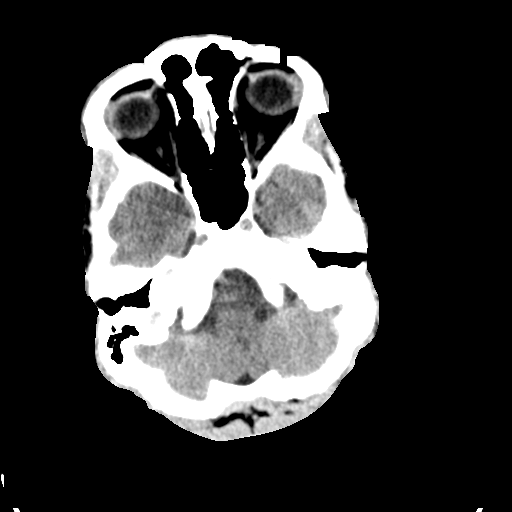
[im 4/30  bone]
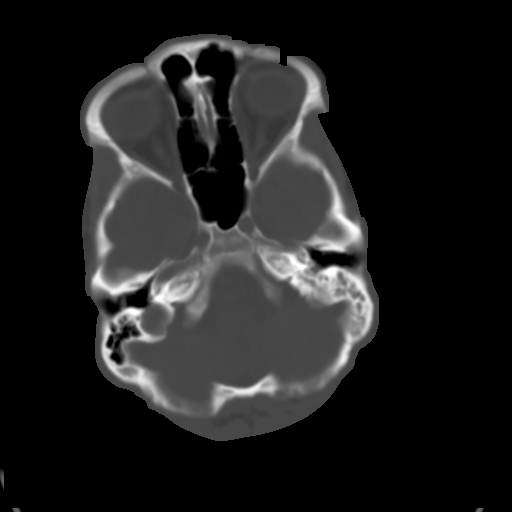
[im 8/30  brain]
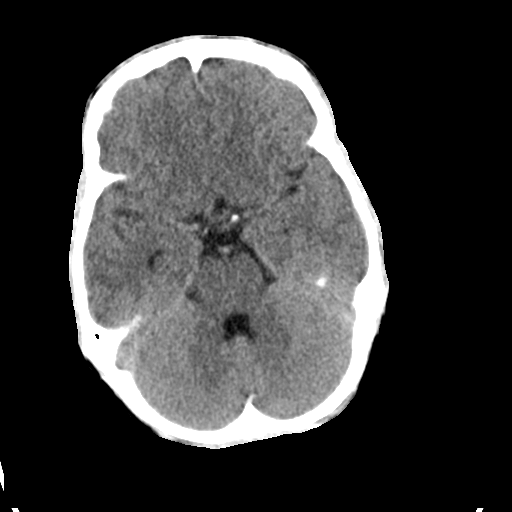
[im 11/30  brain]
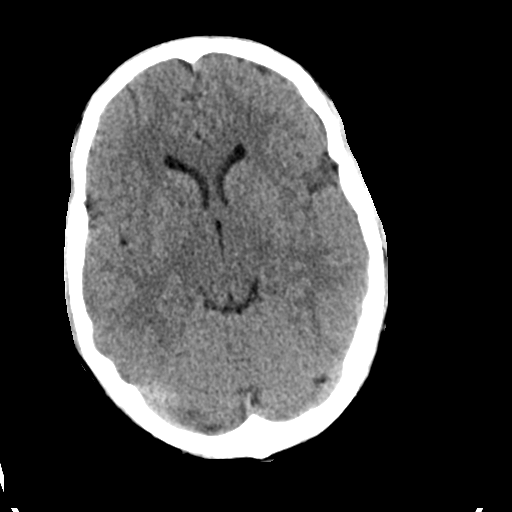
[im 15/30  brain]
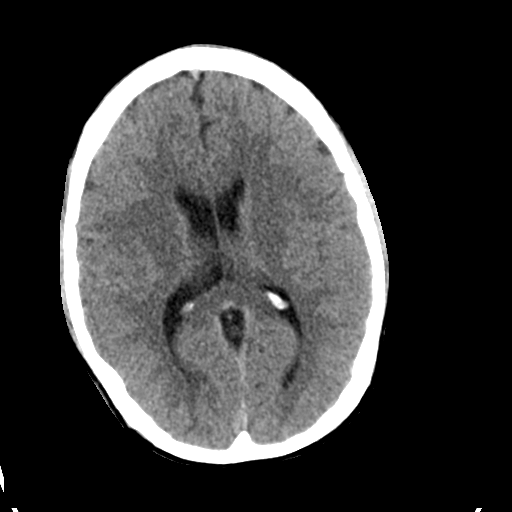
[im 19/30  brain]
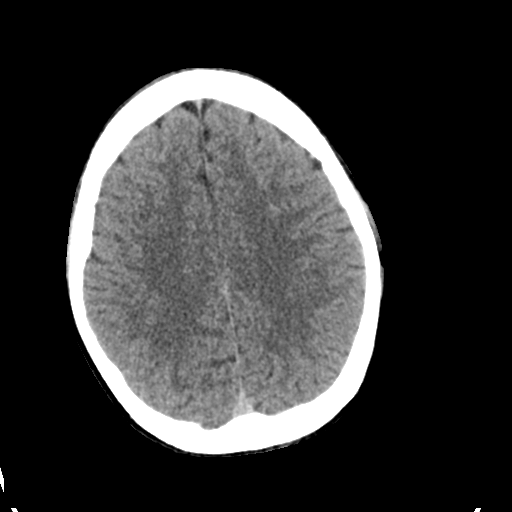
[im 19/30  bone]
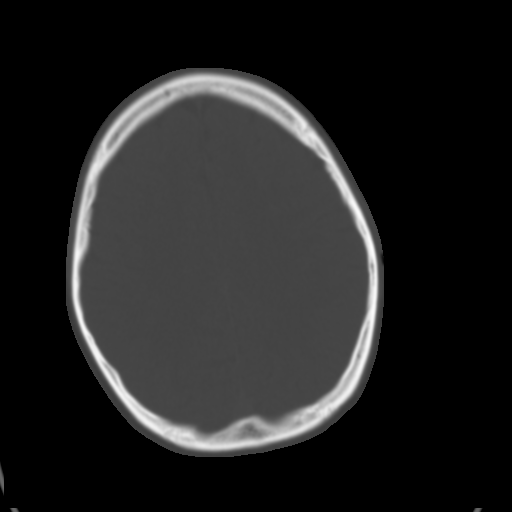
[im 22/30  brain]
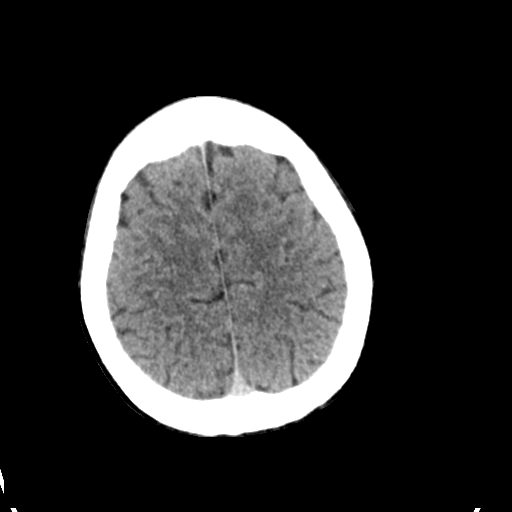
[im 26/30  brain]
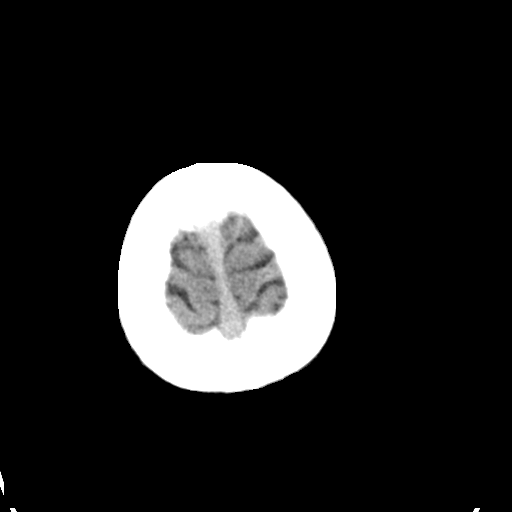

[Series 3: head bone · axial · 0.44mm/px · z∈[+1410,+1528]mm · 8 of 75 slices shown]
[im 8/75  bone]
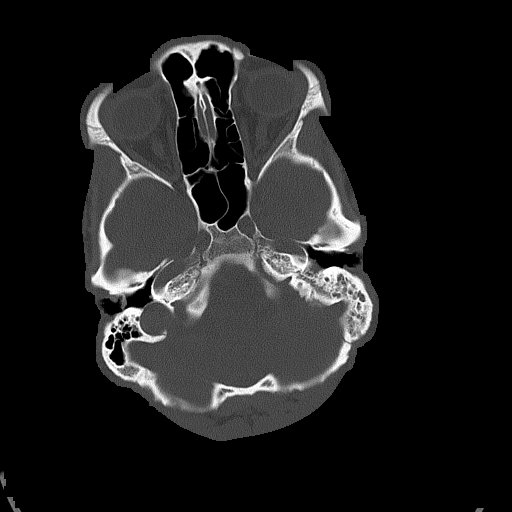
[im 15/75  bone]
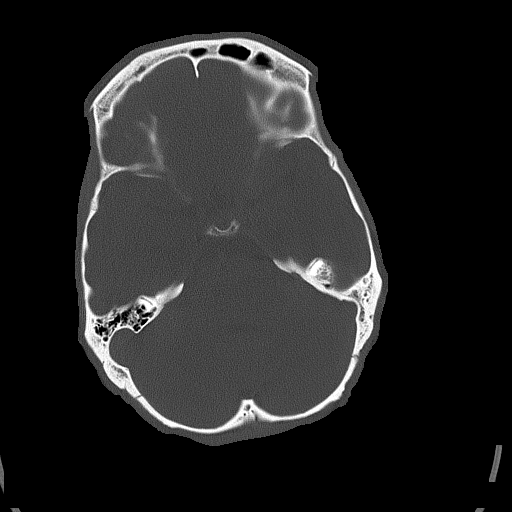
[im 23/75  bone]
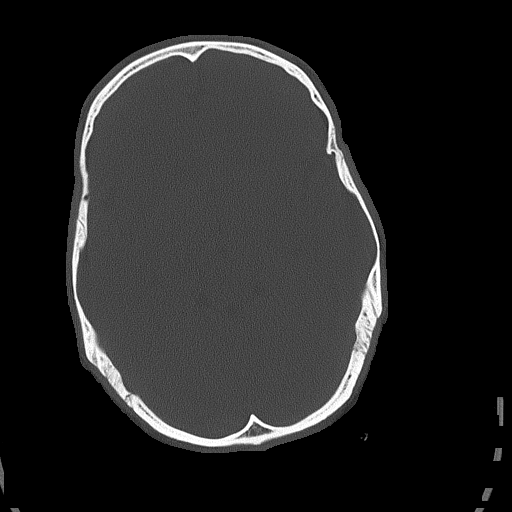
[im 34/75  bone]
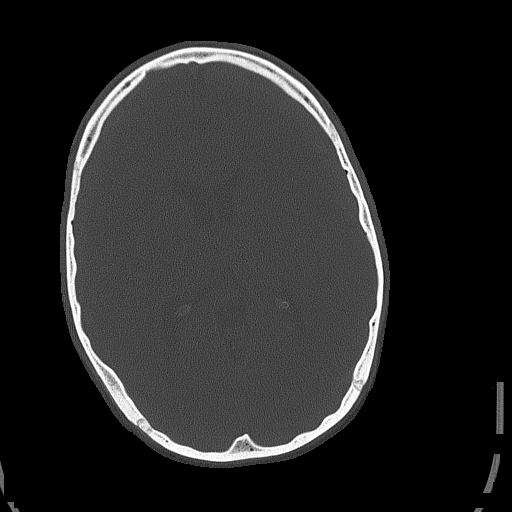
[im 41/75  bone]
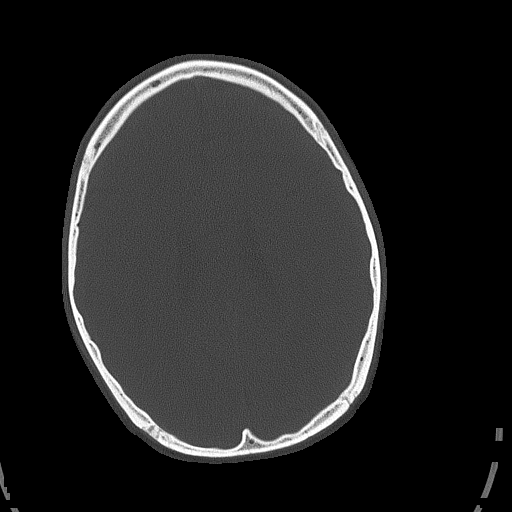
[im 52/75  bone]
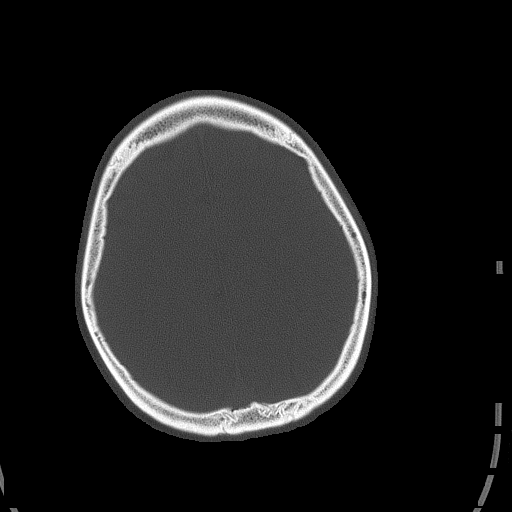
[im 60/75  bone]
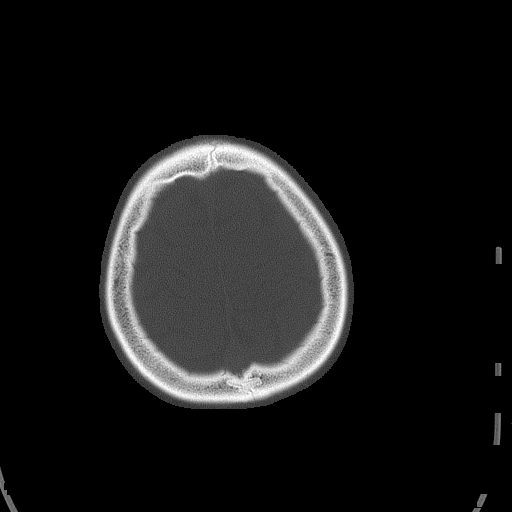
[im 67/75  bone]
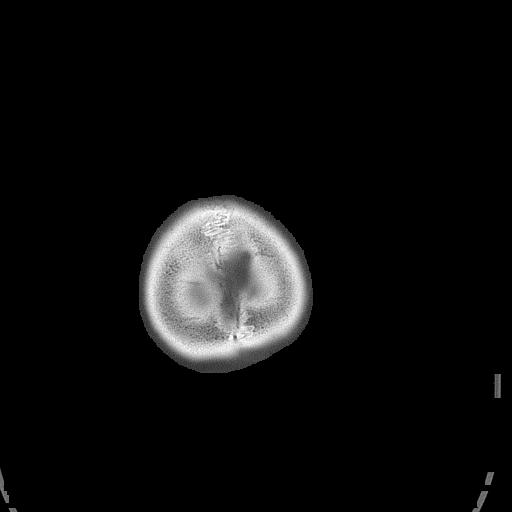

[15 of 30 positions shown; findings below may reference images not displayed]

FINDINGS: No evidence for acute infarction, hemorrhage, mass lesion,
hydrocephalus, or extra-axial fluid. No atrophy or white matter
disease. Intact calvarium. No acute sinus disease.

Over the LEFT posterior frontoparietal region, there is a scalp
hematoma and laceration. See image 46 series 3. No foreign body.

Redemonstrated is LEFT mastoid fluid. No visible subtemporal
inflammatory process.

Based on review of prior MR, no LEFT transverse/sigmoid venous sinus
thrombosis or abnormal enhancement in the LEFT temporal lobe.

Compared with priors, no change except for scalp hematoma.
IMPRESSION: No skull fracture or intracranial hemorrhage.

Normal intracranial anatomy.  No seizure focus is evident.

Stable LEFT mastoid effusion.

LEFT posterior frontoparietal scalp hematoma and laceration.
